# Patient Record
Sex: Male | Born: 1955 | Race: Black or African American | Hispanic: No | Smoking: Current every day smoker
Health system: Southern US, Community
[De-identification: ages and names within clinical notes are randomized; demographics above are authoritative.]

## PROBLEM LIST (undated history)

## (undated) DIAGNOSIS — E119 Type 2 diabetes mellitus without complications: Secondary | ICD-10-CM

## (undated) DIAGNOSIS — I10 Essential (primary) hypertension: Secondary | ICD-10-CM

## (undated) DIAGNOSIS — J449 Chronic obstructive pulmonary disease, unspecified: Secondary | ICD-10-CM

## (undated) HISTORY — PX: HIP SURGERY: SHX245

---

## 2017-07-15 ENCOUNTER — Other Ambulatory Visit: Payer: Self-pay

## 2017-07-15 ENCOUNTER — Emergency Department (HOSPITAL_COMMUNITY)
Admission: EM | Admit: 2017-07-15 | Discharge: 2017-07-15 | Disposition: A | Discharge status: Home / Self Care | Payer: Medicaid Other | Attending: Emergency Medicine | Admitting: Emergency Medicine

## 2017-07-15 ENCOUNTER — Emergency Department (HOSPITAL_COMMUNITY): Payer: Medicaid Other

## 2017-07-15 ENCOUNTER — Encounter (HOSPITAL_COMMUNITY): Payer: Self-pay

## 2017-07-15 DIAGNOSIS — R05 Cough: Secondary | ICD-10-CM | POA: Insufficient documentation

## 2017-07-15 DIAGNOSIS — M25562 Pain in left knee: Secondary | ICD-10-CM | POA: Insufficient documentation

## 2017-07-15 DIAGNOSIS — M25552 Pain in left hip: Secondary | ICD-10-CM | POA: Insufficient documentation

## 2017-07-15 DIAGNOSIS — E119 Type 2 diabetes mellitus without complications: Secondary | ICD-10-CM | POA: Diagnosis not present

## 2017-07-15 DIAGNOSIS — R55 Syncope and collapse: Secondary | ICD-10-CM | POA: Insufficient documentation

## 2017-07-15 DIAGNOSIS — F17200 Nicotine dependence, unspecified, uncomplicated: Secondary | ICD-10-CM | POA: Diagnosis not present

## 2017-07-15 DIAGNOSIS — I10 Essential (primary) hypertension: Secondary | ICD-10-CM | POA: Diagnosis not present

## 2017-07-15 DIAGNOSIS — J449 Chronic obstructive pulmonary disease, unspecified: Secondary | ICD-10-CM | POA: Insufficient documentation

## 2017-07-15 HISTORY — DX: Essential (primary) hypertension: I10

## 2017-07-15 HISTORY — DX: Chronic obstructive pulmonary disease, unspecified: J44.9

## 2017-07-15 HISTORY — DX: Type 2 diabetes mellitus without complications: E11.9

## 2017-07-15 LAB — CBC WITH DIFFERENTIAL/PLATELET
Basophils Absolute: 0 10*3/uL (ref 0.0–0.1)
Basophils Relative: 0 %
EOS PCT: 7 %
Eosinophils Absolute: 0.4 10*3/uL (ref 0.0–0.7)
HCT: 34.2 % — ABNORMAL LOW (ref 39.0–52.0)
Hemoglobin: 11.2 g/dL — ABNORMAL LOW (ref 13.0–17.0)
LYMPHS ABS: 2 10*3/uL (ref 0.7–4.0)
LYMPHS PCT: 35 %
MCH: 31.3 pg (ref 26.0–34.0)
MCHC: 32.7 g/dL (ref 30.0–36.0)
MCV: 95.5 fL (ref 78.0–100.0)
MONO ABS: 0.9 10*3/uL (ref 0.1–1.0)
Monocytes Relative: 16 %
Neutro Abs: 2.4 10*3/uL (ref 1.7–7.7)
Neutrophils Relative %: 42 %
PLATELETS: 294 10*3/uL (ref 150–400)
RBC: 3.58 MIL/uL — AB (ref 4.22–5.81)
RDW: 14.1 % (ref 11.5–15.5)
WBC: 5.8 10*3/uL (ref 4.0–10.5)

## 2017-07-15 LAB — COMPREHENSIVE METABOLIC PANEL
ALT: 11 U/L — AB (ref 17–63)
AST: 21 U/L (ref 15–41)
Albumin: 3.8 g/dL (ref 3.5–5.0)
Alkaline Phosphatase: 94 U/L (ref 38–126)
Anion gap: 9 (ref 5–15)
BUN: 14 mg/dL (ref 6–20)
CHLORIDE: 107 mmol/L (ref 101–111)
CO2: 25 mmol/L (ref 22–32)
CREATININE: 1.02 mg/dL (ref 0.61–1.24)
Calcium: 9.5 mg/dL (ref 8.9–10.3)
GFR calc Af Amer: >60 mL/min (ref >60)
GFR calc non Af Amer: >60 mL/min (ref >60)
GLUCOSE: 93 mg/dL (ref 65–99)
Potassium: 3.8 mmol/L (ref 3.5–5.1)
SODIUM: 141 mmol/L (ref 135–145)
Total Bilirubin: 0.3 mg/dL (ref 0.3–1.2)
Total Protein: 7.5 g/dL (ref 6.5–8.1)

## 2017-07-15 LAB — URINALYSIS, ROUTINE W REFLEX MICROSCOPIC
Bacteria, UA: NONE SEEN
Bilirubin Urine: NEGATIVE
Glucose, UA: NEGATIVE mg/dL
Hgb urine dipstick: NEGATIVE
KETONES UR: NEGATIVE mg/dL
Nitrite: NEGATIVE
PH: 5 (ref 5.0–8.0)
Protein, ur: NEGATIVE mg/dL
Specific Gravity, Urine: 1.012 (ref 1.005–1.030)

## 2017-07-15 LAB — CBG MONITORING, ED: GLUCOSE-CAPILLARY: 113 mg/dL — AB (ref 65–99)

## 2017-07-15 LAB — TROPONIN I: Troponin I: <0.03 ng/mL (ref <0.03)

## 2017-07-15 NOTE — ED Triage Notes (Signed)
[facility] and right pain. Patient state he is been having frequent fall. Pt state he will cough and end up falling on the fall. Pt have hx of copd. C/o more pain to left hip. Pt was given 50 mcg of fentanyl per ems.

## 2017-07-15 NOTE — Discharge Instructions (Signed)
Take your usual prescriptions as previously directed.  Call your regular medical doctor on Monday to schedule a follow up appointment within the next 3 days.  Return to the Emergency Department immediately sooner if worsening.  ° °

## 2017-07-15 NOTE — ED Notes (Signed)
Po given. Pt tolerated well.

## 2017-07-15 NOTE — ED Notes (Signed)
Bed: WA21 Expected date:  Expected time:  Means of arrival:  Comments: 62 yo fall w/ LOC, seizure?

## 2017-07-15 NOTE — ED Provider Notes (Signed)
[facility]-EMERGENCY DEPT Provider Note   CSN: [id number] Arrival date & time: 07/15/17  1214     History   Chief Complaint No chief complaint on file.   HPI Ricky Nolan is a 62 y.o. male.  HPI  Pt was seen at 1325.  Per pt, c/o sudden onset and resolution of one episode of syncope that occurred PTA. Pt states he "got coughing" and "then woke up on the floor." Pt states he has hx of same, previously fx his left hip when he fell. Pt states he again fell onto his left knee, then left hip during this fall, c/o flair of chronic left hip pain. Pt states he was able to stand up after the fall and walk. Denies CP/palpitations, no SOB/cough, no abd pain, no N/V/D, no neck or back pain, no visual changes, no focal motor weakness, no tingling/numbness in extremities, no slurred speech, no facial droop.    Past Medical History:  Diagnosis Date  . COPD (chronic obstructive pulmonary disease) (HCC)   . Diabetes mellitus without complication (HCC)   . Hypertension     There are no active problems to display for this patient.      Home Medications    Prior to Admission medications   Not on File    Family History No family history on file.  Social History Social History   Tobacco Use  . Smoking status: Current Every Day Smoker  Substance Use Topics  . Alcohol use: Not on file  . Drug use: Not on file     Allergies   Patient has no allergy information on record.   Review of Systems Review of Systems ROS: Statement: All systems negative except as marked or noted in the HPI; Constitutional: Negative for fever and chills. ; ; Eyes: Negative for eye pain, redness and discharge. ; ; ENMT: Negative for ear pain, hoarseness, nasal congestion, sinus pressure and sore throat. ; ; Cardiovascular: Negative for chest pain, palpitations, diaphoresis, dyspnea and peripheral edema. ; ; Respiratory: Negative for cough, wheezing and stridor. ; ;  Gastrointestinal: Negative for nausea, vomiting, diarrhea, abdominal pain, blood in stool, hematemesis, jaundice and rectal bleeding. . ; ; Genitourinary: Negative for dysuria, flank pain and hematuria. ; ; Musculoskeletal: +left hip pain. Negative for back pain and neck pain. Negative for swelling and deformity.; ; Skin: Negative for pruritus, rash, abrasions, blisters, bruising and skin lesion.; ; Neuro: Negative for headache, lightheadedness and neck stiffness. Negative for weakness, altered mental status, extremity weakness, paresthesias, involuntary movement, seizure and +syncope.      Physical Exam Updated Vital Signs BP 133/73   Pulse 86   Temp 98.3 F (36.8 C) (Oral)   Resp 19   SpO2 93%     14:27 Orthostatic Vital Signs TH  Orthostatic Lying   BP- Lying: 147/74   Pulse- Lying: 79       Orthostatic Sitting  BP- Sitting: 128/68   Pulse- Sitting: 93       Orthostatic Standing at 0 minutes  BP- Standing at 0 minutes: 143/66   Pulse- Standing at 0 minutes: 89       Orthostatic Standing at 3 minutes  BP- Standing at 3 minutes: 133/73   Pulse- Standing at 3 minutes: 87      Physical Exam 1330: Physical examination:  Nursing notes reviewed; Vital signs and O2 SAT reviewed;  Constitutional: Well developed, Well nourished, Well hydrated, In no acute distress; Head:  Normocephalic, atraumatic; Eyes: EOMI, PERRL,  No scleral icterus; ENMT: Mouth and pharynx normal, Mucous membranes moist; Neck: Supple, Full range of motion, No lymphadenopathy; Cardiovascular: Regular rate and rhythm, No gallop; Respiratory: Breath sounds clear & equal bilaterally, No wheezes.  Speaking full sentences with ease, Normal respiratory effort/excursion; Chest: Nontender, Movement normal; Abdomen: Soft, Nontender, Nondistended, Normal bowel sounds; Genitourinary: No CVA tenderness; Spine:  No midline CS, TS, LS tenderness.;; Extremities: Peripheral pulses normal, Pelvis stable. +left hip tenderness to  palp. No deformity. NT left knee/ankle/foot. No edema, No calf edema or asymmetry.; Neuro: AA&Ox3, Major CN grossly intact. No facial droop. Speech clear. No gross focal motor or sensory deficits in extremities.; Skin: Color normal, Warm, Dry.   ED Treatments / Results  Labs (all labs ordered are listed, but only abnormal results are displayed)   EKG EKG Interpretation  Date/Time:  Saturday July 15 2017 14:25:19 EDT Ventricular Rate:  82 PR Interval:    QRS Duration: 90 QT Interval:  364 QTC Calculation: 426 R Axis:   71 Text Interpretation:  Sinus rhythm Borderline T wave abnormalities No old tracing to compare Confirmed by Samuel Jester [phone]) on 07/15/2017 2:39:28 PM   Radiology   Procedures Procedures (including critical care time)  Medications Ordered in ED Medications - No data to display   Initial Impression / Assessment and Plan / ED Course  I have reviewed the triage vital signs and the nursing notes.  Pertinent labs & imaging results that were available during my care of the patient were reviewed by me and considered in my medical decision making (see chart for details).  MDM Reviewed: nursing note and vitals Interpretation: labs, ECG, x-ray and CT scan   Results for orders placed or performed during the hospital encounter of 07/15/17  Comprehensive metabolic panel  Result Value Ref Range   Sodium 141 135 - 145 mmol/L   Potassium 3.8 3.5 - 5.1 mmol/L   Chloride 107 101 - 111 mmol/L   CO2 25 22 - 32 mmol/L   Glucose, Bld 93 65 - 99 mg/dL   BUN 14 6 - 20 mg/dL   Creatinine, Ser 6.04 0.61 - 1.24 mg/dL   Calcium 9.5 8.9 - 54.0 mg/dL   Total Protein 7.5 6.5 - 8.1 g/dL   Albumin 3.8 3.5 - 5.0 g/dL   AST 21 15 - 41 U/L   ALT 11 (L) 17 - 63 U/L   Alkaline Phosphatase 94 38 - 126 U/L   Total Bilirubin 0.3 0.3 - 1.2 mg/dL   GFR calc non Af Amer >60 >60 mL/min   GFR calc Af Amer >60 >60 mL/min   Anion gap 9 5 - 15  Troponin I  Result Value Ref Range     Troponin I <0.03 <0.03 ng/mL  CBC with Differential  Result Value Ref Range   WBC 5.8 4.0 - 10.5 K/uL   RBC 3.58 (L) 4.22 - 5.81 MIL/uL   Hemoglobin 11.2 (L) 13.0 - 17.0 g/dL   HCT 98.1 (L) 19.1 - 47.8 %   MCV 95.5 78.0 - 100.0 fL   MCH 31.3 26.0 - 34.0 pg   MCHC 32.7 30.0 - 36.0 g/dL   RDW 29.5 62.1 - 30.8 %   Platelets 294 150 - 400 K/uL   Neutrophils Relative % 42 %   Neutro Abs 2.4 1.7 - 7.7 K/uL   Lymphocytes Relative 35 %   Lymphs Abs 2.0 0.7 - 4.0 K/uL   Monocytes Relative 16 %   Monocytes Absolute 0.9 0.1 - 1.0 K/uL  Eosinophils Relative 7 %   Eosinophils Absolute 0.4 0.0 - 0.7 K/uL   Basophils Relative 0 %   Basophils Absolute 0.0 0.0 - 0.1 K/uL  CBG monitoring, ED  Result Value Ref Range   Glucose-Capillary 113 (H) 65 - 99 mg/dL   Dg Chest 2 View Result Date: 07/15/2017 CLINICAL DATA:  Fall after seizure. EXAM: CHEST - 2 VIEW COMPARISON:  Radiographs of August 15, 2015. FINDINGS: The heart size and mediastinal contours are within normal limits. Both lungs are clear. No pneumothorax or pleural effusion is noted. The visualized skeletal structures are unremarkable. IMPRESSION: No active cardiopulmonary disease. Electronically Signed   By: Lupita Raider, M.D.   On: 07/15/2017 14:31   Ct Head Wo Contrast Result Date: 07/15/2017 CLINICAL DATA:  Head injury after fall. EXAM: CT HEAD WITHOUT CONTRAST CT CERVICAL SPINE WITHOUT CONTRAST TECHNIQUE: Multidetector CT imaging of the head and cervical spine was performed following the standard protocol without intravenous contrast. Multiplanar CT image reconstructions of the cervical spine were also generated. COMPARISON:  None. FINDINGS: CT HEAD FINDINGS Brain: No evidence of acute infarction, hemorrhage, hydrocephalus, extra-axial collection or mass lesion/mass effect. Vascular: No hyperdense vessel or unexpected calcification. Skull: Normal. Negative for fracture or focal lesion. Sinuses/Orbits: Right maxillary sinusitis is noted.  Other: None. CT CERVICAL SPINE FINDINGS Alignment: Mild reversal of normal lordosis is noted which most likely is positional in origin. Skull base and vertebrae: No acute fracture. No primary bone lesion or focal pathologic process. Soft tissues and spinal canal: No prevertebral fluid or swelling. No visible canal hematoma. Disc levels: Moderate degenerative disc disease is noted at C4-5, C5-6 and C6-7 with anterior osteophyte formation. Upper chest: Emphysematous disease is noted in the upper lobes bilaterally. Other: None. IMPRESSION: No acute intracranial abnormality seen.  Right maxillary sinusitis. Moderate multilevel degenerative disc disease. No fracture or spondylolisthesis is noted in the cervical spine. Emphysema (ICD10-J43.9). Electronically Signed   By: Lupita Raider, M.D.   On: 07/15/2017 14:28   Ct Cervical Spine Wo Contrast Result Date: 07/15/2017 CLINICAL DATA:  Head injury after fall. EXAM: CT HEAD WITHOUT CONTRAST CT CERVICAL SPINE WITHOUT CONTRAST TECHNIQUE: Multidetector CT imaging of the head and cervical spine was performed following the standard protocol without intravenous contrast. Multiplanar CT image reconstructions of the cervical spine were also generated. COMPARISON:  None. FINDINGS: CT HEAD FINDINGS Brain: No evidence of acute infarction, hemorrhage, hydrocephalus, extra-axial collection or mass lesion/mass effect. Vascular: No hyperdense vessel or unexpected calcification. Skull: Normal. Negative for fracture or focal lesion. Sinuses/Orbits: Right maxillary sinusitis is noted. Other: None. CT CERVICAL SPINE FINDINGS Alignment: Mild reversal of normal lordosis is noted which most likely is positional in origin. Skull base and vertebrae: No acute fracture. No primary bone lesion or focal pathologic process. Soft tissues and spinal canal: No prevertebral fluid or swelling. No visible canal hematoma. Disc levels: Moderate degenerative disc disease is noted at C4-5, C5-6 and C6-7 with  anterior osteophyte formation. Upper chest: Emphysematous disease is noted in the upper lobes bilaterally. Other: None. IMPRESSION: No acute intracranial abnormality seen.  Right maxillary sinusitis. Moderate multilevel degenerative disc disease. No fracture or spondylolisthesis is noted in the cervical spine. Emphysema (ICD10-J43.9). Electronically Signed   By: Lupita Raider, M.D.   On: 07/15/2017 14:28   Dg Knee Complete 4 Views Left Result Date: 07/15/2017 CLINICAL DATA:  Fall today.  Prior surgery. EXAM: LEFT KNEE - COMPLETE 4+ VIEW COMPARISON:  None. FINDINGS: Incompletely imaged nail and screw fixation  of the distal femur. Mild osteopenia suspected. No acute fracture or dislocation. No hardware complication. Vascular calcifications. No knee joint effusion. Artifact degradation on multiple views. suspect remote medial collateral ligament injury as evidenced by ossification at the origin. IMPRESSION: No acute osseous abnormality. Electronically Signed   By: Jeronimo Greaves M.D.   On: 07/15/2017 14:32   Dg Hip Unilat With Pelvis 2-3 Views Left Result Date: 07/15/2017 CLINICAL DATA:  Fall.  Seizure. EXAM: DG HIP (WITH OR WITHOUT PELVIS) 2-3V LEFT COMPARISON:  None FINDINGS: Previous left proximal femur ORIF with IM rod and screw device. No acute fracture or dislocation identified. IMPRESSION: 1. No acute findings. 2. Previous ORIF of proximal left femur. Electronically Signed   By: Signa Kell M.D.   On: 07/15/2017 14:29    1600:  Low risk syncope scores ([place]). Not orthostatic on VS. Workup reassuring. UA pending. Sign out to Dr. Karma Ganja.     Final Clinical Impressions(s) / ED Diagnoses   Final diagnoses:  None    ED Discharge Orders    None       Samuel Jester, DO 07/15/17 1602

## 2017-09-10 ENCOUNTER — Emergency Department (HOSPITAL_COMMUNITY)
Admission: EM | Admit: 2017-09-10 | Discharge: 2017-09-10 | Disposition: A | Discharge status: Home / Self Care | Payer: Medicaid Other | Attending: Emergency Medicine | Admitting: Emergency Medicine

## 2017-09-10 ENCOUNTER — Encounter (HOSPITAL_COMMUNITY): Payer: Self-pay | Admitting: *Deleted

## 2017-09-10 ENCOUNTER — Emergency Department (HOSPITAL_COMMUNITY): Payer: Medicaid Other

## 2017-09-10 DIAGNOSIS — R112 Nausea with vomiting, unspecified: Secondary | ICD-10-CM | POA: Insufficient documentation

## 2017-09-10 DIAGNOSIS — E119 Type 2 diabetes mellitus without complications: Secondary | ICD-10-CM | POA: Diagnosis not present

## 2017-09-10 DIAGNOSIS — I10 Essential (primary) hypertension: Secondary | ICD-10-CM | POA: Diagnosis not present

## 2017-09-10 DIAGNOSIS — F17200 Nicotine dependence, unspecified, uncomplicated: Secondary | ICD-10-CM | POA: Insufficient documentation

## 2017-09-10 DIAGNOSIS — R079 Chest pain, unspecified: Secondary | ICD-10-CM | POA: Diagnosis present

## 2017-09-10 DIAGNOSIS — J449 Chronic obstructive pulmonary disease, unspecified: Secondary | ICD-10-CM | POA: Diagnosis not present

## 2017-09-10 LAB — CBC
HCT: 45.9 % (ref 39.0–52.0)
Hemoglobin: 15 g/dL (ref 13.0–17.0)
MCH: 29.8 pg (ref 26.0–34.0)
MCHC: 32.7 g/dL (ref 30.0–36.0)
MCV: 91.1 fL (ref 78.0–100.0)
PLATELETS: 272 10*3/uL (ref 150–400)
RBC: 5.04 MIL/uL (ref 4.22–5.81)
RDW: 15.1 % (ref 11.5–15.5)
WBC: 7.7 10*3/uL (ref 4.0–10.5)

## 2017-09-10 LAB — BASIC METABOLIC PANEL
Anion gap: 12 (ref 5–15)
BUN: 18 mg/dL (ref 6–20)
CALCIUM: 9.5 mg/dL (ref 8.9–10.3)
CO2: 28 mmol/L (ref 22–32)
CREATININE: 1.12 mg/dL (ref 0.61–1.24)
Chloride: 99 mmol/L — ABNORMAL LOW (ref 101–111)
Glucose, Bld: 143 mg/dL — ABNORMAL HIGH (ref 65–99)
Potassium: 4.3 mmol/L (ref 3.5–5.1)
SODIUM: 139 mmol/L (ref 135–145)

## 2017-09-10 LAB — I-STAT TROPONIN, ED
TROPONIN I, POC: 0 ng/mL (ref 0.00–0.08)
TROPONIN I, POC: 0.01 ng/mL (ref 0.00–0.08)

## 2017-09-10 MED ORDER — ONDANSETRON 4 MG PO TBDP: 4.0000 mg | ORAL_TABLET | Freq: Three times a day (TID) | ORAL | 0 refills | Status: AC | PRN

## 2017-09-10 MED ORDER — ALBUTEROL SULFATE HFA 108 (90 BASE) MCG/ACT IN AERS: 1.0000 | INHALATION_SPRAY | Freq: Four times a day (QID) | RESPIRATORY_TRACT | 0 refills | Status: AC | PRN

## 2017-09-10 MED ORDER — METHYLPREDNISOLONE SODIUM SUCC 125 MG IJ SOLR
125.0000 mg | Freq: Once | INTRAMUSCULAR | Status: AC
  Administered 2017-09-10: 125 mg via INTRAVENOUS
  Filled 2017-09-10: qty 2

## 2017-09-10 MED ORDER — PREDNISONE 20 MG PO TABS: 20.0000 mg | ORAL_TABLET | Freq: Two times a day (BID) | ORAL | 0 refills | Status: AC

## 2017-09-10 MED ORDER — ONDANSETRON HCL 4 MG/2ML IJ SOLN
4.0000 mg | Freq: Once | INTRAMUSCULAR | Status: AC
  Administered 2017-09-10: 4 mg via INTRAVENOUS
  Filled 2017-09-10: qty 2

## 2017-09-10 MED ORDER — ALBUTEROL SULFATE (2.5 MG/3ML) 0.083% IN NEBU
2.5000 mg | INHALATION_SOLUTION | Freq: Once | RESPIRATORY_TRACT | Status: AC
  Administered 2017-09-10: 2.5 mg via RESPIRATORY_TRACT
  Filled 2017-09-10: qty 3

## 2017-09-10 MED ORDER — SODIUM CHLORIDE 0.9 % IV BOLUS
500.0000 mL | Freq: Once | INTRAVENOUS | Status: AC
  Administered 2017-09-10: 500 mL via INTRAVENOUS

## 2017-09-10 MED ORDER — OMEPRAZOLE 20 MG PO CPDR: 20.0000 mg | DELAYED_RELEASE_CAPSULE | Freq: Two times a day (BID) | ORAL | 0 refills | Status: AC

## 2017-09-10 NOTE — Discharge Instructions (Addendum)
Zofran for nausea.  Push fluids to stay hydrated.

## 2017-09-10 NOTE — ED Triage Notes (Signed)
Pt arrived by [facility] for emesis x 2 days and now having left side chest pains that are sharp in nature and recent cough.

## 2017-09-10 NOTE — ED Provider Notes (Signed)
[facility] EMERGENCY DEPARTMENT Provider Note   CSN: [id number] Arrival date & time: 09/10/17  1050     History   Chief Complaint Chief Complaint  Patient presents with  . Chest Pain  . Emesis    HPI Hero Dajaun Goldring is a 62 y.o. male.  Complaint is cough, difficulty breathing, nausea.  HPI 62 year old male.  History of COPD, diabetes, hypertension.  Nausea for the last 2 to 3 days.  States his abdomen is "sore".  No localized pain.  States his chest was sore today after coughing and vomiting.  He has had a cough productive of yellow sputum.  No fevers.  He has nebulizer and inhalers at home.  Continues to smoke.  Past Medical History:  Diagnosis Date  . COPD (chronic obstructive pulmonary disease) (HCC)   . Diabetes mellitus without complication (HCC)   . Hypertension     There are no active problems to display for this patient.   Past Surgical History:  Procedure Laterality Date  . HIP SURGERY     left hip        Home Medications    Prior to Admission medications   Medication Sig Start Date End Date Taking? Authorizing Provider  ondansetron (ZOFRAN ODT) 4 MG disintegrating tablet Take 1 tablet (4 mg total) by mouth every 8 (eight) hours as needed for nausea. 09/10/17   Rolland Porter, MD    Family History History reviewed. No pertinent family history.  Social History Social History   Tobacco Use  . Smoking status: Current Every Day Smoker  Substance Use Topics  . Alcohol use: Not on file  . Drug use: Not on file     Allergies   Patient has no allergy information on record.   Review of Systems Review of Systems  Constitutional: Negative for appetite change, chills, diaphoresis, fatigue and fever.  HENT: Negative for mouth sores, sore throat and trouble swallowing.   Eyes: Negative for visual disturbance.  Respiratory: Positive for cough and shortness of breath. Negative for chest tightness and wheezing.   Cardiovascular:  Positive for chest pain.  Gastrointestinal: Positive for abdominal pain, nausea and vomiting. Negative for abdominal distention and diarrhea.  Endocrine: Negative for polydipsia, polyphagia and polyuria.  Genitourinary: Negative for dysuria, frequency and hematuria.  Musculoskeletal: Negative for gait problem.  Skin: Negative for color change, pallor and rash.  Neurological: Negative for dizziness, syncope, light-headedness and headaches.  Hematological: Does not bruise/bleed easily.  Psychiatric/Behavioral: Negative for behavioral problems and confusion.     Physical Exam Updated Vital Signs BP (!) 146/85   Pulse 81   Temp 98 F (36.7 C) (Oral)   Resp (!) 23   SpO2 97%   Physical Exam  Constitutional: He is oriented to person, place, and time. No distress.  Some extraparametal like lipsmacking and humming.  Y is lucid and fluent speech  HENT:  Head: Normocephalic.  Eyes: Pupils are equal, round, and reactive to light. Conjunctivae are normal. No scleral icterus.  Neck: Normal range of motion. Neck supple. No thyromegaly present.  Cardiovascular: Normal rate and regular rhythm. Exam reveals no gallop and no friction rub.  No murmur heard. Pulmonary/Chest: Effort normal and breath sounds normal. No respiratory distress. He has no wheezes. He has no rales.  Mild wheeze and minimal prolongation.  No asymmetry or focal diminished breath sounds  Abdominal: Soft. Bowel sounds are normal. He exhibits no distension. There is no tenderness. There is no rebound.  Soft abdomen.  No guarding rebound or localized tenderness  Musculoskeletal: Normal range of motion.  Neurological: He is alert and oriented to person, place, and time.  Skin: Skin is warm and dry. No rash noted.  Psychiatric: He has a normal mood and affect. His behavior is normal.     ED Treatments / Results  Labs (all labs ordered are listed, but only abnormal results are displayed) Labs Reviewed  BASIC METABOLIC PANEL  - Abnormal; Notable for the following components:      Result Value   Chloride 99 (*)    Glucose, Bld 143 (*)    All other components within normal limits  CBC  I-STAT TROPONIN, ED  I-STAT TROPONIN, ED    EKG EKG Interpretation  Date/Time:  /26/19 1815       Rolland Porter, MD 09/10/17 1815

## 2018-11-03 IMAGING — CR DG HIP (WITH OR WITHOUT PELVIS) 2-3V*L*
3 series · 3 of 3 positions shown · non-contrast
Comparison: None

CLINICAL DATA: Fall.  Seizure.

EXAM:
DG HIP (WITH OR WITHOUT PELVIS) 2-3V LEFT

[t pelvis ap]
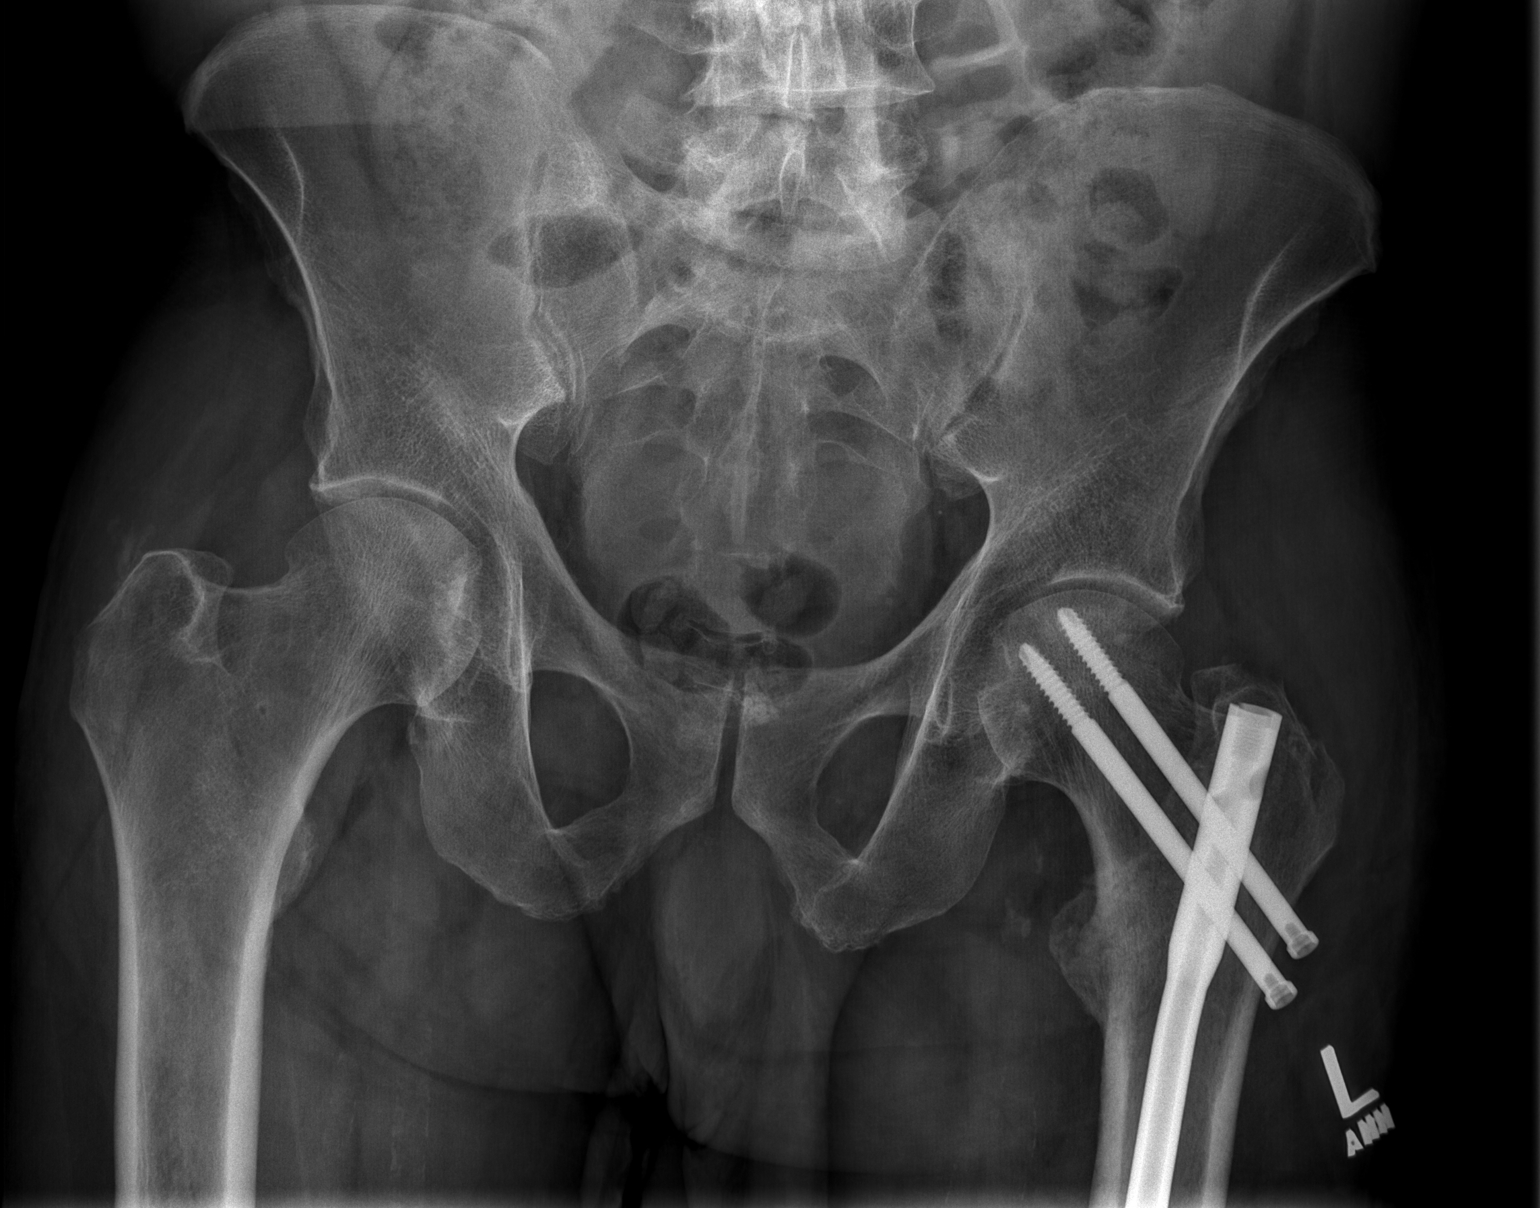

[t hip ap left]
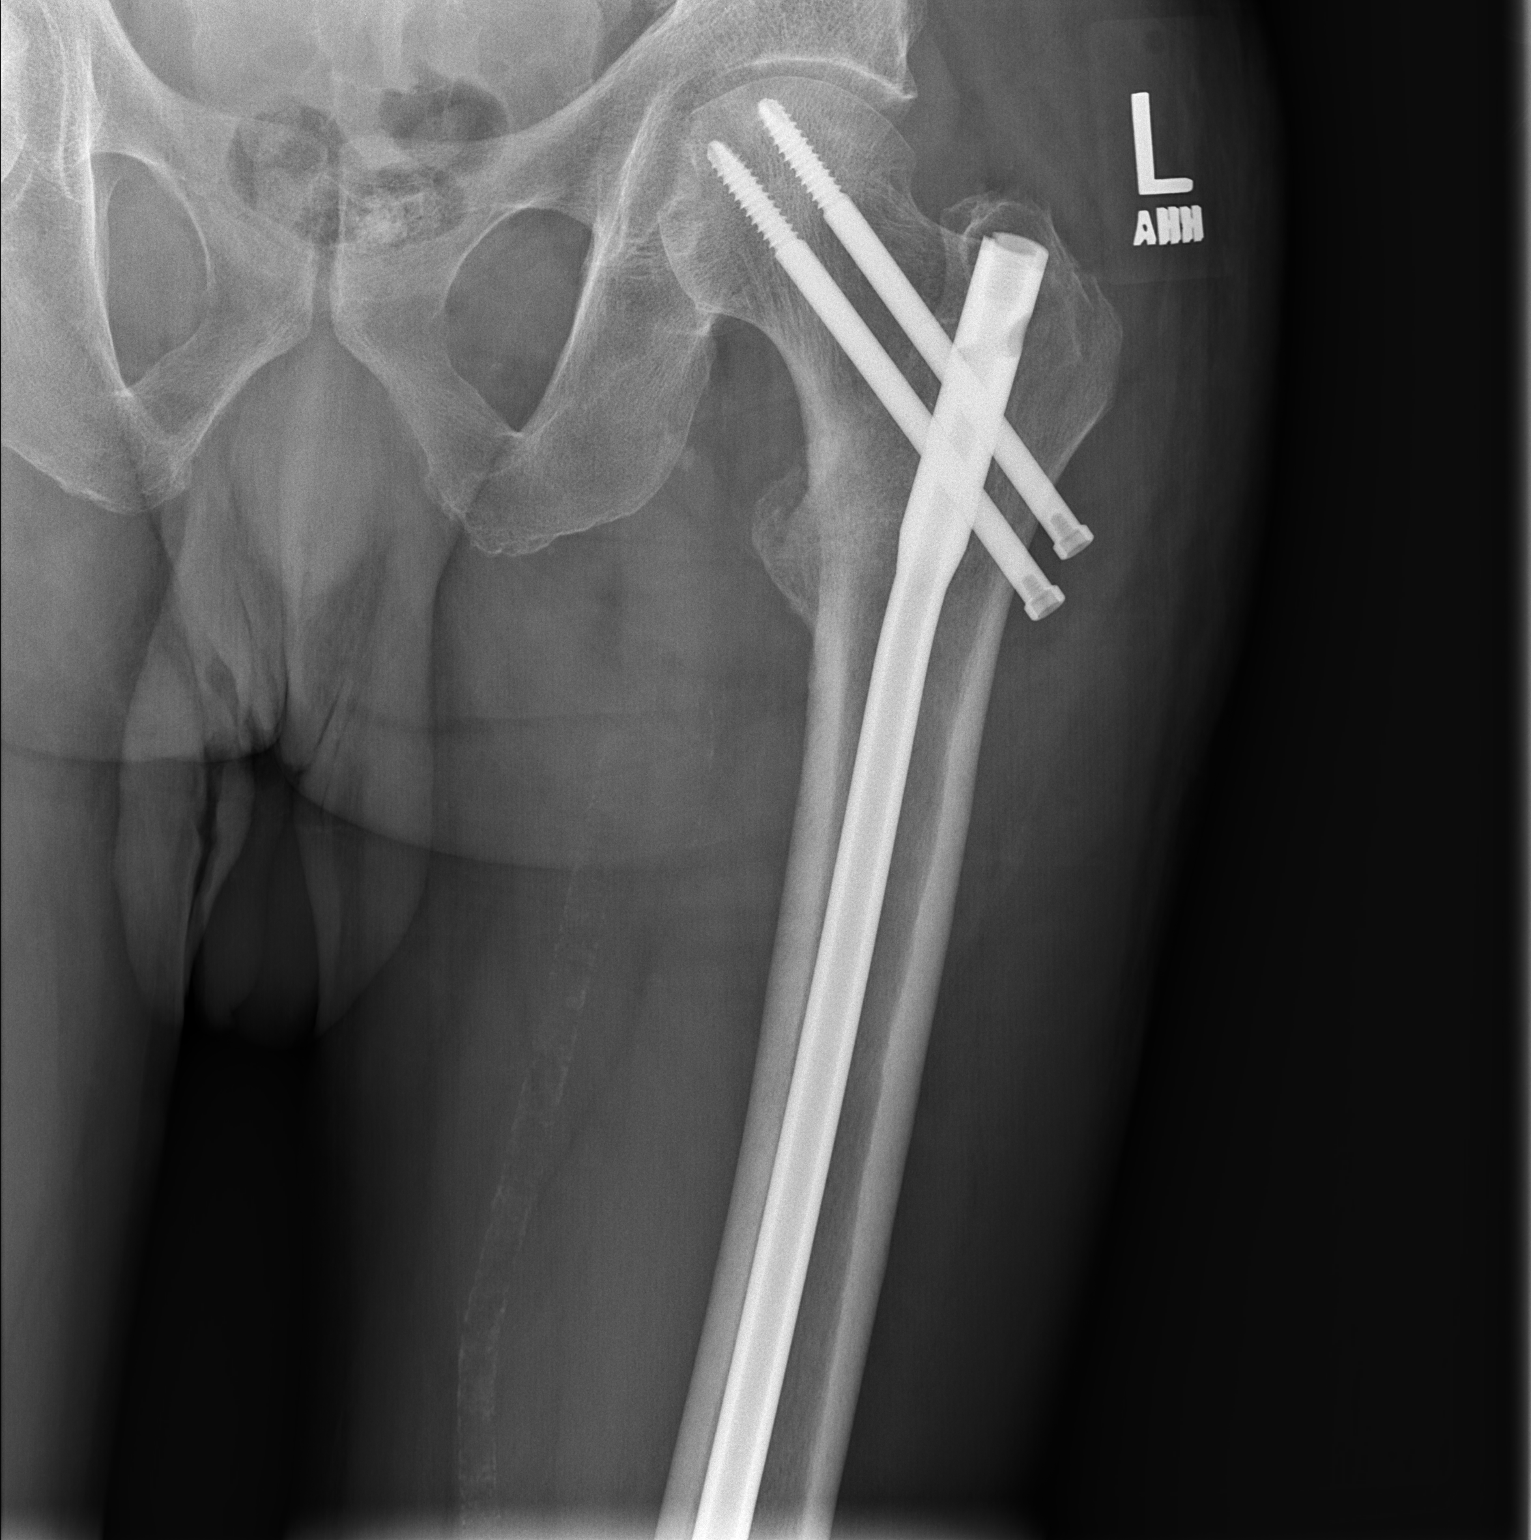

[w hip lat left]
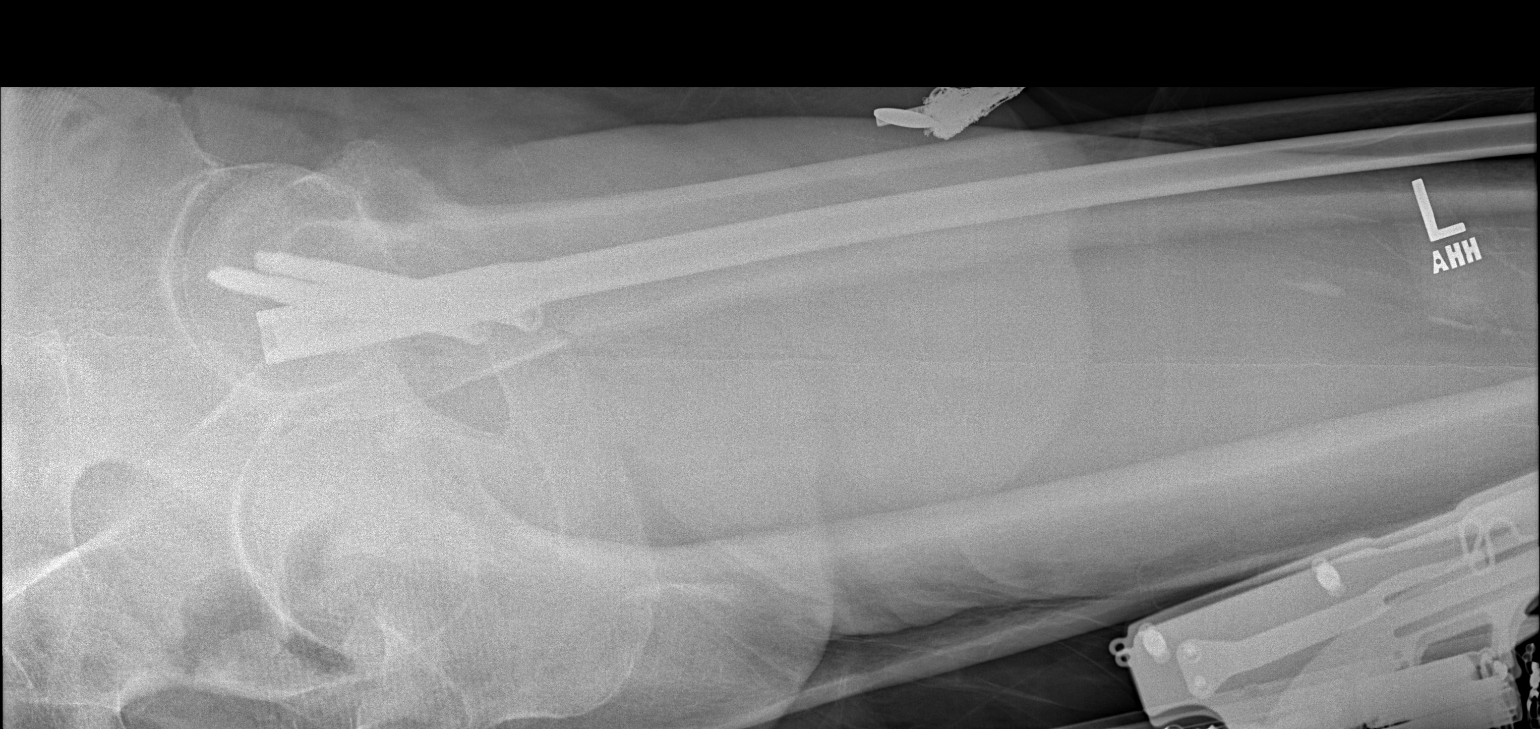

[3 of 3 positions shown; findings below may reference images not displayed]

FINDINGS: Previous left proximal femur ORIF with IM rod and screw device. No
acute fracture or dislocation identified.
IMPRESSION: 1. No acute findings.
2. Previous ORIF of proximal left femur.

## 2018-11-03 IMAGING — CR DG CHEST 2V
2 series · 2 of 2 positions shown · non-contrast
Comparison: Radiographs August 15, 2015.

CLINICAL DATA: Fall after seizure.

EXAM:
CHEST - 2 VIEW

[w chest pa]
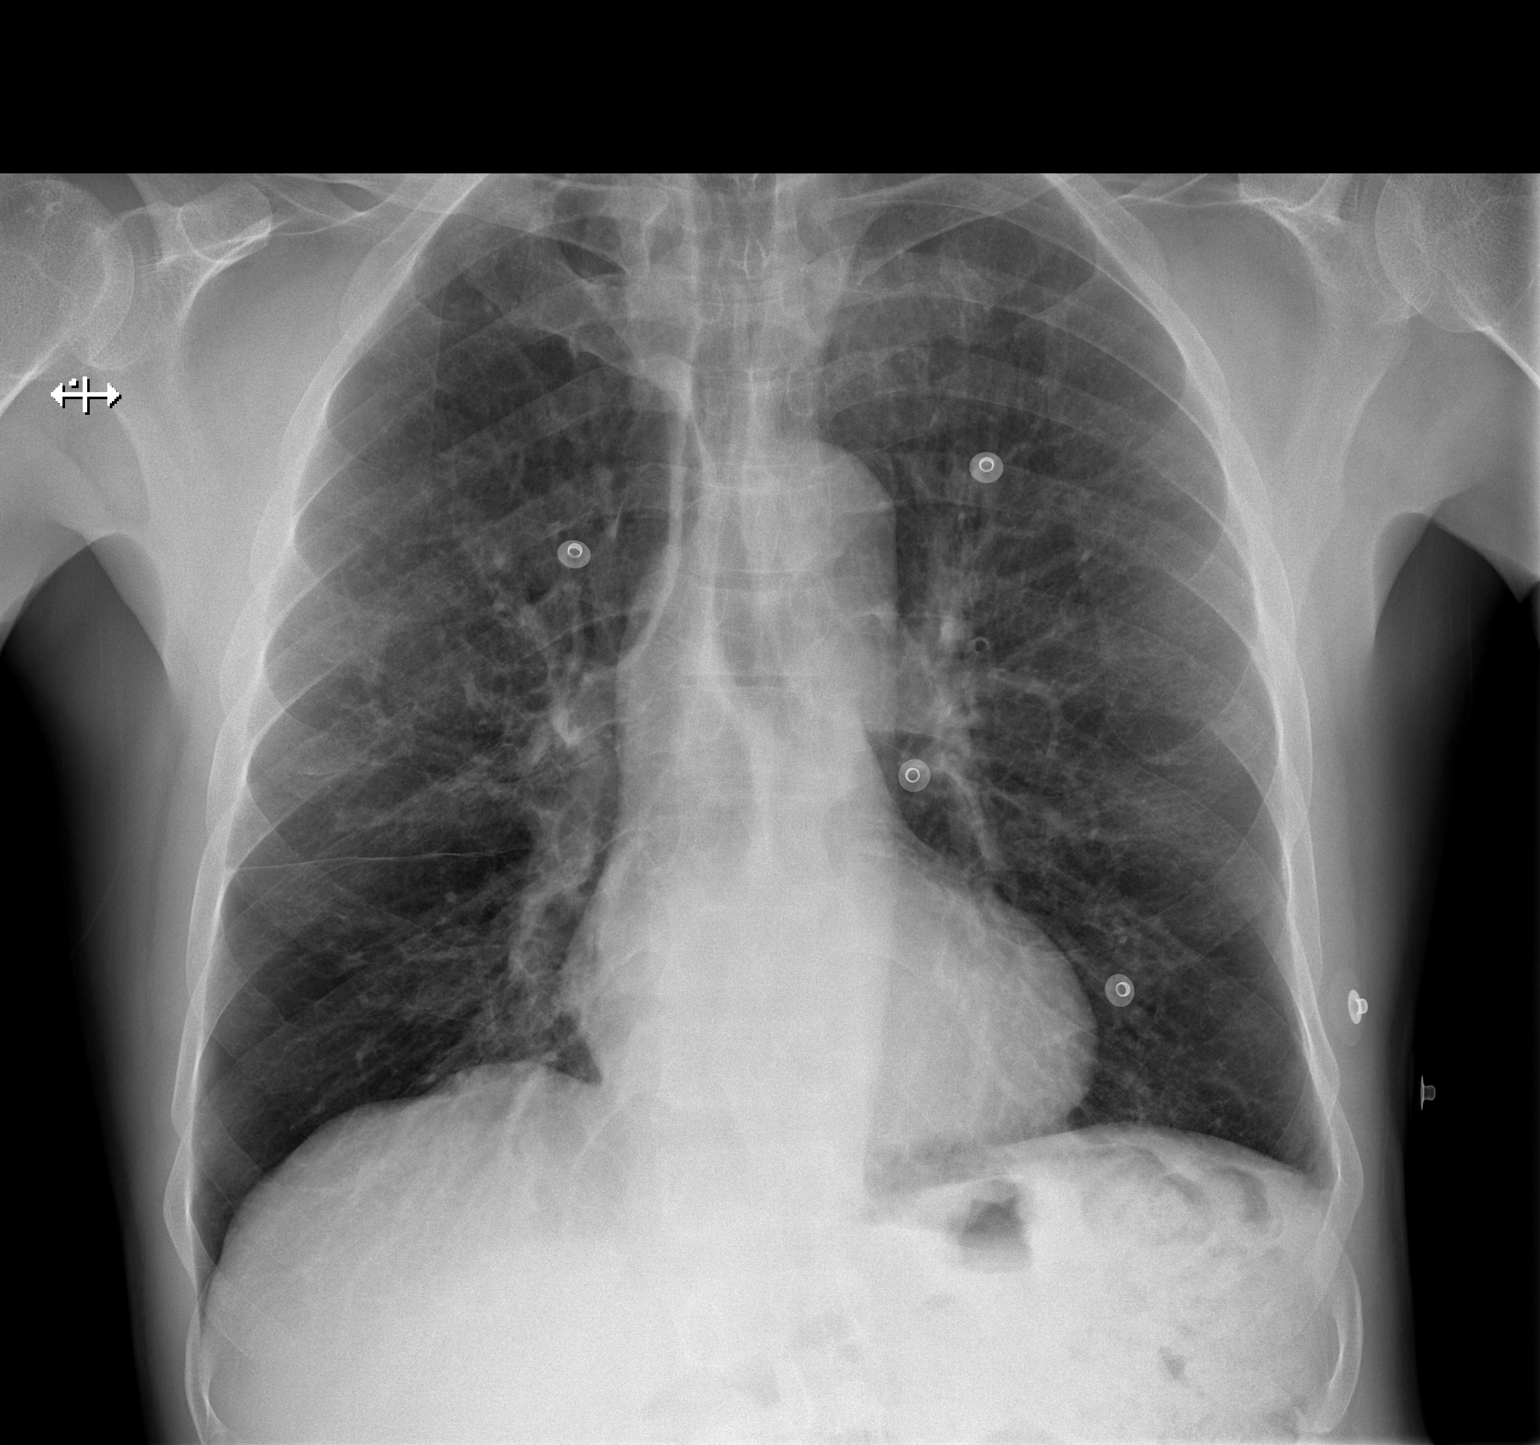

[w chest lat]
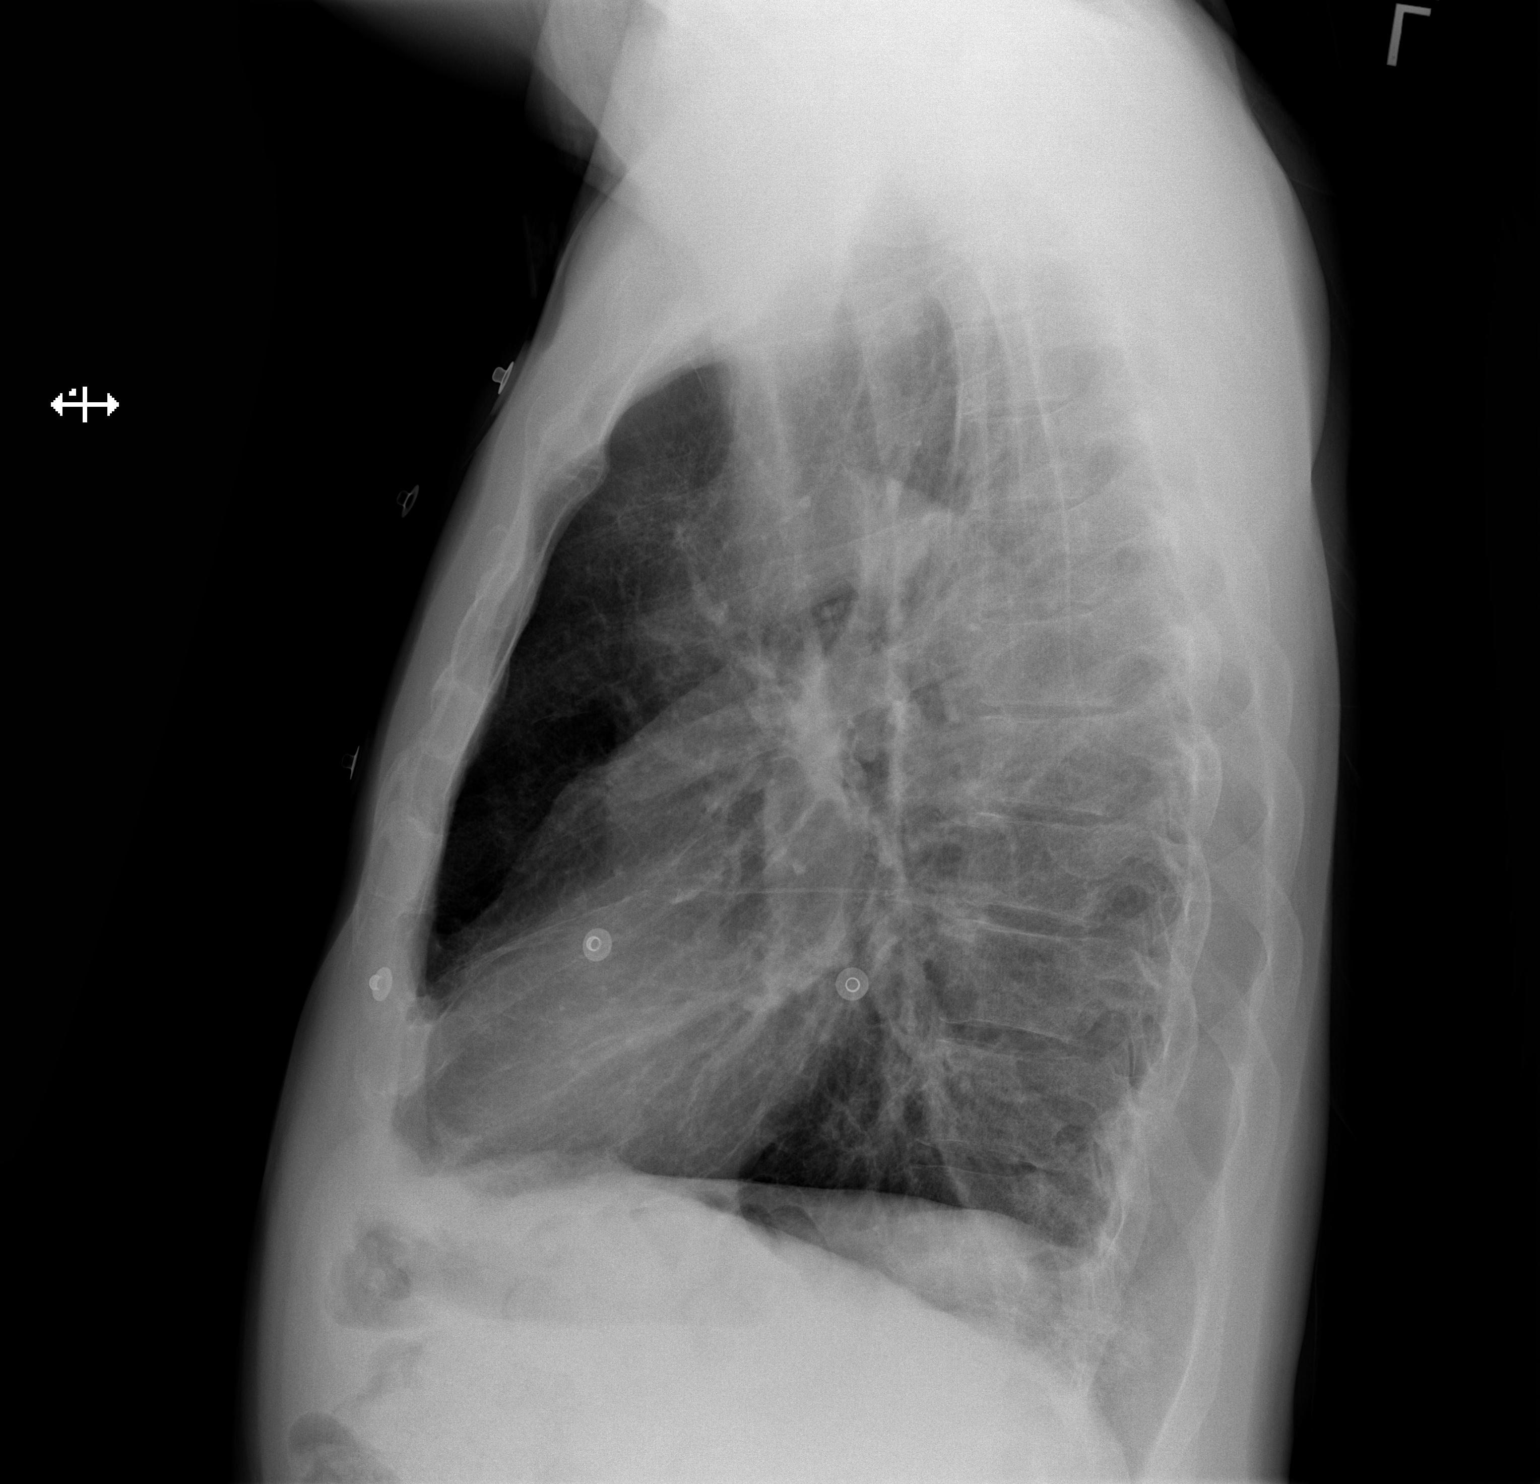

[2 of 2 positions shown; findings below may reference images not displayed]

FINDINGS: The heart size and mediastinal contours are within normal limits.
Both lungs are clear. No pneumothorax or pleural effusion is noted.
The visualized skeletal structures are unremarkable.
IMPRESSION: No active cardiopulmonary disease.
